# Patient Record
Sex: Female | Born: 2005 | Race: Black or African American | Hispanic: No | Marital: Single | Smoking: Never smoker
Health system: Southern US, Community
[De-identification: ages and names within clinical notes are randomized; demographics above are authoritative.]

## PROBLEM LIST (undated history)

## (undated) DIAGNOSIS — G971 Other reaction to spinal and lumbar puncture: Secondary | ICD-10-CM

## (undated) HISTORY — PX: EYE SURGERY: SHX253

## (undated) HISTORY — DX: Other reaction to spinal and lumbar puncture: G97.1

---
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
## 2024-01-27 DIAGNOSIS — Z419 Encounter for procedure for purposes other than remedying health state, unspecified: Secondary | ICD-10-CM | POA: Diagnosis not present

## 2024-02-27 DIAGNOSIS — Z419 Encounter for procedure for purposes other than remedying health state, unspecified: Secondary | ICD-10-CM | POA: Diagnosis not present

## 2024-03-28 DIAGNOSIS — Z419 Encounter for procedure for purposes other than remedying health state, unspecified: Secondary | ICD-10-CM | POA: Diagnosis not present

## 2024-04-28 DIAGNOSIS — Z419 Encounter for procedure for purposes other than remedying health state, unspecified: Secondary | ICD-10-CM | POA: Diagnosis not present

## 2024-05-29 DIAGNOSIS — Z419 Encounter for procedure for purposes other than remedying health state, unspecified: Secondary | ICD-10-CM | POA: Diagnosis not present

## 2024-06-11 ENCOUNTER — Other Ambulatory Visit: Payer: Self-pay

## 2024-06-11 ENCOUNTER — Encounter: Payer: Self-pay | Admitting: Family Medicine

## 2024-06-11 ENCOUNTER — Ambulatory Visit

## 2024-06-11 ENCOUNTER — Ambulatory Visit: Admitting: Family Medicine

## 2024-06-11 VITALS — BP 124/78 | HR 78 | Ht 66.02 in | Wt 161.0 lb

## 2024-06-11 DIAGNOSIS — M25511 Pain in right shoulder: Secondary | ICD-10-CM

## 2024-06-11 DIAGNOSIS — M2480 Other specific joint derangements of unspecified joint, not elsewhere classified: Secondary | ICD-10-CM

## 2024-06-11 DIAGNOSIS — M357 Hypermobility syndrome: Secondary | ICD-10-CM | POA: Diagnosis not present

## 2024-06-11 NOTE — Progress Notes (Unsigned)
   I, Leotis Batter, CMA acting as a scribe for Artist Lloyd, MD.  Molly Kane is a 18 y.o. female who presents to [facility] at [facility] today for shoulder pain. Pt was previously seen by Dr. Lloyd on 12/24/23 for L wrist pain.  Today, pt c/o R shoulder pain x 2 days, started after throwing a pillow at a friend, felt like the shoulder subluxed. Pt locates pain to anterior aspect of the shoulder. Radiating pain into the upper arm. Denies n/t/w or decreased grip strength. ROM well maintained but pain with overhead reaching and reaching across the body.   Radiates: upper arm Aggravates: side-lying, hand behind the head Treatments tried: Tylenol  Pertinent review of systems: No fevers or chills  Relevant historical information: Personal history of patellar subluxation/dislocation   Exam:  BP 124/78   Pulse 78   Ht 5' 6.02 (1.677 m)   Wt 161 lb (73 kg)   SpO2 100%   BMI 25.97 kg/m  General: Well Developed, well nourished, and in no acute distress.   MSK: Right shoulder normal-appearing normal motion Intact strength. Mildly positive anterior apprehension test and O'Brien test. Negative Hawkins and Neer's test.  Hypermobility evaluation: Beighton hypermobility score 6/9. Ehlers-Danlos evaluation is significant only for 4 criteria.  Lab and Radiology Results  X-ray images right shoulder obtained today personally and independently interpreted. No acute fractures.  No significant degenerative changes. Await formal radiology review     Assessment and Plan: 18 y.o. female with right shoulder subluxation event.  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  She is a good candidate for physical therapy.  Plan to refer to PT.  Will pick a physical therapist more familiar with hypermobility syndrome. Recheck in 6 weeks.  Of note patient was assessed for Ehlers-Danlos and did not have enough positive criteria.  This could change in the  future.  PDMP not reviewed this encounter. Orders Placed This Encounter  Procedures   DG Shoulder Right    Standing Status:   Future    Number of Occurrences:   1    Expiration Date:   06/11/2025    Reason for Exam (SYMPTOM  OR DIAGNOSIS REQUIRED):   right shoulder pain    Preferred imaging location?:   [facility]    Is patient pregnant?:   No   Ambulatory referral to Physical Therapy    Referral Priority:   Routine    Referral Type:   Physical Medicine    Referral Reason:   Specialty Services Required    Requested Specialty:   Physical Therapy    Number of Visits Requested:   1   No orders of the defined types were placed in this encounter.    Discussed warning signs or symptoms. Please see discharge instructions. Patient expresses understanding.   The above documentation has been reviewed and is accurate and complete Artist Lloyd, M.D.

## 2024-06-11 NOTE — Patient Instructions (Addendum)
 Thank you for coming in today.   Please get an Xray today before you leave.  A referral for physical therapy has been submitted. A representative from the physical therapy office will contact you to coordinate scheduling after confirming your benefits with your insurance provider. If you do not hear from the physical therapy office within the next 1-2 weeks, please let us know.    See you back in 6 weeks

## 2024-06-12 DIAGNOSIS — M357 Hypermobility syndrome: Secondary | ICD-10-CM | POA: Insufficient documentation

## 2024-06-15 ENCOUNTER — Ambulatory Visit: Payer: Self-pay | Admitting: Family Medicine

## 2024-06-15 NOTE — Progress Notes (Signed)
 Right shoulder x-ray looks normal to radiology

## 2024-07-04 ENCOUNTER — Ambulatory Visit

## 2024-07-23 ENCOUNTER — Ambulatory Visit: Admitting: Family Medicine
